# Patient Record
Sex: Male | Born: 1985
Health system: Southern US, Community
[De-identification: ages and names within clinical notes are randomized; demographics above are authoritative.]

## PROBLEM LIST (undated history)

## (undated) DIAGNOSIS — T8859XA Other complications of anesthesia, initial encounter: Secondary | ICD-10-CM

## (undated) DIAGNOSIS — T4145XA Adverse effect of unspecified anesthetic, initial encounter: Secondary | ICD-10-CM

## (undated) DIAGNOSIS — R112 Nausea with vomiting, unspecified: Secondary | ICD-10-CM

## (undated) DIAGNOSIS — Z9889 Other specified postprocedural states: Secondary | ICD-10-CM

---
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
## 2019-07-23 ENCOUNTER — Inpatient Hospital Stay (HOSPITAL_COMMUNITY)
Admission: EM | Admit: 2019-07-23 | Discharge: 2019-07-31 | DRG: 330 | Disposition: A | Discharge status: Home Health | Payer: No Typology Code available for payment source | Attending: Physician Assistant | Admitting: Physician Assistant

## 2019-07-23 ENCOUNTER — Encounter (HOSPITAL_COMMUNITY): Payer: Self-pay | Admitting: Emergency Medicine

## 2019-07-23 DIAGNOSIS — S36409A Unspecified injury of unspecified part of small intestine, initial encounter: Principal | ICD-10-CM | POA: Diagnosis present

## 2019-07-23 DIAGNOSIS — W3400XA Accidental discharge from unspecified firearms or gun, initial encounter: Secondary | ICD-10-CM

## 2019-07-23 DIAGNOSIS — S36898A Other injury of other intra-abdominal organs, initial encounter: Secondary | ICD-10-CM | POA: Diagnosis present

## 2019-07-23 DIAGNOSIS — D62 Acute posthemorrhagic anemia: Secondary | ICD-10-CM | POA: Diagnosis not present

## 2019-07-23 DIAGNOSIS — Z0189 Encounter for other specified special examinations: Secondary | ICD-10-CM

## 2019-07-23 DIAGNOSIS — K567 Ileus, unspecified: Secondary | ICD-10-CM | POA: Diagnosis not present

## 2019-07-23 DIAGNOSIS — Z9889 Other specified postprocedural states: Secondary | ICD-10-CM

## 2019-07-23 DIAGNOSIS — E119 Type 2 diabetes mellitus without complications: Secondary | ICD-10-CM | POA: Diagnosis present

## 2019-07-23 DIAGNOSIS — N179 Acute kidney failure, unspecified: Secondary | ICD-10-CM | POA: Diagnosis not present

## 2019-07-23 DIAGNOSIS — T799XXA Unspecified early complication of trauma, initial encounter: Secondary | ICD-10-CM

## 2019-07-23 DIAGNOSIS — S36419A Primary blast injury of unspecified part of small intestine, initial encounter: Secondary | ICD-10-CM

## 2019-07-23 DIAGNOSIS — R12 Heartburn: Secondary | ICD-10-CM | POA: Diagnosis not present

## 2019-07-23 DIAGNOSIS — Z20822 Contact with and (suspected) exposure to covid-19: Secondary | ICD-10-CM | POA: Diagnosis present

## 2019-07-23 DIAGNOSIS — T07XXXA Unspecified multiple injuries, initial encounter: Secondary | ICD-10-CM

## 2019-07-23 DIAGNOSIS — R202 Paresthesia of skin: Secondary | ICD-10-CM | POA: Diagnosis not present

## 2019-07-23 DIAGNOSIS — E876 Hypokalemia: Secondary | ICD-10-CM | POA: Diagnosis not present

## 2019-07-23 DIAGNOSIS — R42 Dizziness and giddiness: Secondary | ICD-10-CM | POA: Diagnosis not present

## 2019-07-23 DIAGNOSIS — Y249XXA Unspecified firearm discharge, undetermined intent, initial encounter: Secondary | ICD-10-CM

## 2019-07-23 HISTORY — DX: Other complications of anesthesia, initial encounter: T88.59XA

## 2019-07-23 HISTORY — DX: Other specified postprocedural states: Z98.890

## 2019-07-23 HISTORY — DX: Other specified postprocedural states: R11.2

## 2019-07-24 ENCOUNTER — Emergency Department (HOSPITAL_COMMUNITY): Payer: No Typology Code available for payment source

## 2019-07-24 ENCOUNTER — Encounter (HOSPITAL_COMMUNITY): Payer: Self-pay | Admitting: Anesthesiology

## 2019-07-24 ENCOUNTER — Emergency Department (HOSPITAL_COMMUNITY): Payer: No Typology Code available for payment source | Admitting: Anesthesiology

## 2019-07-24 ENCOUNTER — Encounter (HOSPITAL_COMMUNITY): Admission: EM | Disposition: A | Discharge status: Home Health | Payer: Self-pay | Source: Home / Self Care

## 2019-07-24 ENCOUNTER — Inpatient Hospital Stay (HOSPITAL_COMMUNITY): Payer: No Typology Code available for payment source

## 2019-07-24 DIAGNOSIS — R12 Heartburn: Secondary | ICD-10-CM | POA: Diagnosis not present

## 2019-07-24 DIAGNOSIS — R202 Paresthesia of skin: Secondary | ICD-10-CM | POA: Diagnosis not present

## 2019-07-24 DIAGNOSIS — R42 Dizziness and giddiness: Secondary | ICD-10-CM | POA: Diagnosis not present

## 2019-07-24 DIAGNOSIS — Z9889 Other specified postprocedural states: Secondary | ICD-10-CM

## 2019-07-24 DIAGNOSIS — W3400XA Accidental discharge from unspecified firearms or gun, initial encounter: Secondary | ICD-10-CM

## 2019-07-24 DIAGNOSIS — T799XXA Unspecified early complication of trauma, initial encounter: Secondary | ICD-10-CM | POA: Diagnosis present

## 2019-07-24 DIAGNOSIS — S36409A Unspecified injury of unspecified part of small intestine, initial encounter: Secondary | ICD-10-CM | POA: Diagnosis present

## 2019-07-24 DIAGNOSIS — S36419A Primary blast injury of unspecified part of small intestine, initial encounter: Secondary | ICD-10-CM

## 2019-07-24 DIAGNOSIS — Z20822 Contact with and (suspected) exposure to covid-19: Secondary | ICD-10-CM | POA: Diagnosis present

## 2019-07-24 DIAGNOSIS — Y249XXA Unspecified firearm discharge, undetermined intent, initial encounter: Secondary | ICD-10-CM

## 2019-07-24 DIAGNOSIS — S36898A Other injury of other intra-abdominal organs, initial encounter: Secondary | ICD-10-CM | POA: Diagnosis present

## 2019-07-24 DIAGNOSIS — N179 Acute kidney failure, unspecified: Secondary | ICD-10-CM | POA: Diagnosis not present

## 2019-07-24 DIAGNOSIS — E876 Hypokalemia: Secondary | ICD-10-CM | POA: Diagnosis not present

## 2019-07-24 DIAGNOSIS — K567 Ileus, unspecified: Secondary | ICD-10-CM | POA: Diagnosis not present

## 2019-07-24 DIAGNOSIS — E119 Type 2 diabetes mellitus without complications: Secondary | ICD-10-CM | POA: Diagnosis present

## 2019-07-24 DIAGNOSIS — D62 Acute posthemorrhagic anemia: Secondary | ICD-10-CM | POA: Diagnosis not present

## 2019-07-24 HISTORY — DX: Unspecified firearm discharge, undetermined intent, initial encounter: Y24.9XXA

## 2019-07-24 HISTORY — DX: Accidental discharge from unspecified firearms or gun, initial encounter: W34.00XA

## 2019-07-24 HISTORY — PX: LAPAROTOMY: SHX154

## 2019-07-24 LAB — CBC
HCT: 31.7 % — ABNORMAL LOW (ref 39.0–52.0)
HCT: 43.3 % (ref 39.0–52.0)
Hemoglobin: 10 g/dL — ABNORMAL LOW (ref 13.0–17.0)
Hemoglobin: 14.2 g/dL (ref 13.0–17.0)
MCH: 31.6 pg (ref 26.0–34.0)
MCH: 31.7 pg (ref 26.0–34.0)
MCHC: 31.5 g/dL (ref 30.0–36.0)
MCHC: 32.8 g/dL (ref 30.0–36.0)
MCV: 100.3 fL — ABNORMAL HIGH (ref 80.0–100.0)
MCV: 96.7 fL (ref 80.0–100.0)
Platelets: 220 10*3/uL (ref 150–400)
Platelets: 289 10*3/uL (ref 150–400)
RBC: 3.16 MIL/uL — ABNORMAL LOW (ref 4.22–5.81)
RBC: 4.48 MIL/uL (ref 4.22–5.81)
RDW: 12.9 % (ref 11.5–15.5)
RDW: 13.1 % (ref 11.5–15.5)
WBC: 12.3 10*3/uL — ABNORMAL HIGH (ref 4.0–10.5)
WBC: 8.7 10*3/uL (ref 4.0–10.5)
nRBC: 0 % (ref 0.0–0.2)
nRBC: 0 % (ref 0.0–0.2)

## 2019-07-24 LAB — PREPARE FRESH FROZEN PLASMA
Unit division: 0
Unit division: 0
Unit division: 0

## 2019-07-24 LAB — BPAM FFP
Blood Product Expiration Date: 202107082359
Blood Product Expiration Date: 202107082359
Blood Product Expiration Date: 202107082359
Blood Product Expiration Date: 202107082359
ISSUE DATE / TIME: 202107040001
ISSUE DATE / TIME: 202107040001
ISSUE DATE / TIME: 202107040036
ISSUE DATE / TIME: 202107040036
Unit Type and Rh: 600
Unit Type and Rh: 6200
Unit Type and Rh: 6200
Unit Type and Rh: 6200

## 2019-07-24 LAB — COMPREHENSIVE METABOLIC PANEL WITH GFR
ALT: 26 U/L (ref 0–44)
AST: 26 U/L (ref 15–41)
Albumin: 3.6 g/dL (ref 3.5–5.0)
Alkaline Phosphatase: 62 U/L (ref 38–126)
Anion gap: 13 (ref 5–15)
BUN: 16 mg/dL (ref 6–20)
CO2: 17 mmol/L — ABNORMAL LOW (ref 22–32)
Calcium: 7.8 mg/dL — ABNORMAL LOW (ref 8.9–10.3)
Chloride: 106 mmol/L (ref 98–111)
Creatinine, Ser: 1.53 mg/dL — ABNORMAL HIGH (ref 0.61–1.24)
GFR calc Af Amer: >60 mL/min
GFR calc non Af Amer: 58 mL/min — ABNORMAL LOW
Glucose, Bld: 200 mg/dL — ABNORMAL HIGH (ref 70–99)
Potassium: 3.8 mmol/L (ref 3.5–5.1)
Sodium: 136 mmol/L (ref 135–145)
Total Bilirubin: 0.8 mg/dL (ref 0.3–1.2)
Total Protein: 6.3 g/dL — ABNORMAL LOW (ref 6.5–8.1)

## 2019-07-24 LAB — BASIC METABOLIC PANEL
Anion gap: 9 (ref 5–15)
BUN: 16 mg/dL (ref 6–20)
CO2: 18 mmol/L — ABNORMAL LOW (ref 22–32)
Calcium: 7 mg/dL — ABNORMAL LOW (ref 8.9–10.3)
Chloride: 110 mmol/L (ref 98–111)
Creatinine, Ser: 1.33 mg/dL — ABNORMAL HIGH (ref 0.61–1.24)
GFR calc Af Amer: >60 mL/min (ref >60)
GFR calc non Af Amer: >60 mL/min (ref >60)
Glucose, Bld: 207 mg/dL — ABNORMAL HIGH (ref 70–99)
Potassium: 4.2 mmol/L (ref 3.5–5.1)
Sodium: 137 mmol/L (ref 135–145)

## 2019-07-24 LAB — LACTIC ACID, PLASMA
Lactic Acid, Venous: 3.3 mmol/L (ref 0.5–1.9)
Lactic Acid, Venous: 4.3 mmol/L (ref 0.5–1.9)

## 2019-07-24 LAB — I-STAT CHEM 8, ED
BUN: 18 mg/dL (ref 6–20)
Calcium, Ion: 1.08 mmol/L — ABNORMAL LOW (ref 1.15–1.40)
Chloride: 105 mmol/L (ref 98–111)
Creatinine, Ser: 1.5 mg/dL — ABNORMAL HIGH (ref 0.61–1.24)
Glucose, Bld: 195 mg/dL — ABNORMAL HIGH (ref 70–99)
HCT: 41 % (ref 39.0–52.0)
Hemoglobin: 13.9 g/dL (ref 13.0–17.0)
Potassium: 3.9 mmol/L (ref 3.5–5.1)
Sodium: 138 mmol/L (ref 135–145)
TCO2: 21 mmol/L — ABNORMAL LOW (ref 22–32)

## 2019-07-24 LAB — TRAUMA TEG PANEL
CFF Max Amplitude: 17.2 mm (ref 15–32)
Citrated Kaolin (R): 5.2 min (ref 4.6–9.1)
Citrated Rapid TEG (MA): 59.6 mm (ref 52–70)
Lysis at 30 Minutes: 0.2 % (ref 0.0–2.6)

## 2019-07-24 LAB — HIV ANTIBODY (ROUTINE TESTING W REFLEX): HIV Screen 4th Generation wRfx: NONREACTIVE

## 2019-07-24 LAB — SARS CORONAVIRUS 2 BY RT PCR (HOSPITAL ORDER, PERFORMED IN ~~LOC~~ HOSPITAL LAB): SARS Coronavirus 2: NEGATIVE

## 2019-07-24 LAB — ETHANOL: Alcohol, Ethyl (B): <10 mg/dL

## 2019-07-24 LAB — PROTIME-INR
INR: 1.1 (ref 0.8–1.2)
Prothrombin Time: 13.3 seconds (ref 11.4–15.2)

## 2019-07-24 LAB — ABO/RH: ABO/RH(D): O POS

## 2019-07-24 LAB — MRSA PCR SCREENING: MRSA by PCR: NEGATIVE

## 2019-07-24 SURGERY — LAPAROTOMY, EXPLORATORY
Anesthesia: General | Site: Abdomen

## 2019-07-24 MED ORDER — LIDOCAINE 2% (20 MG/ML) 5 ML SYRINGE
INTRAMUSCULAR | Status: DC | PRN
  Administered 2019-07-24: 70 mg via INTRAVENOUS

## 2019-07-24 MED ORDER — PROPOFOL 10 MG/ML IV BOLUS
INTRAVENOUS | Status: DC | PRN
  Administered 2019-07-24: 110 mg via INTRAVENOUS

## 2019-07-24 MED ORDER — 0.9 % SODIUM CHLORIDE (POUR BTL) OPTIME
TOPICAL | Status: DC | PRN
  Administered 2019-07-24: 2000 mL

## 2019-07-24 MED ORDER — HYDROMORPHONE HCL 1 MG/ML IJ SOLN
0.2500 mg | INTRAMUSCULAR | Status: DC | PRN
  Administered 2019-07-24: 0.5 mg via INTRAVENOUS

## 2019-07-24 MED ORDER — SODIUM CHLORIDE 0.9 % IV BOLUS
1000.0000 mL | Freq: Once | INTRAVENOUS | Status: AC
  Administered 2019-07-24: 1000 mL via INTRAVENOUS

## 2019-07-24 MED ORDER — ONDANSETRON 4 MG PO TBDP: 4.0000 mg | ORAL_TABLET | Freq: Four times a day (QID) | ORAL | Status: DC | PRN

## 2019-07-24 MED ORDER — ONDANSETRON HCL 4 MG/2ML IJ SOLN
4.0000 mg | Freq: Four times a day (QID) | INTRAMUSCULAR | Status: DC | PRN
  Administered 2019-07-24 – 2019-07-27 (×4): 4 mg via INTRAVENOUS
  Filled 2019-07-24 (×4): qty 2

## 2019-07-24 MED ORDER — FENTANYL CITRATE (PF) 100 MCG/2ML IJ SOLN
INTRAMUSCULAR | Status: DC | PRN
  Administered 2019-07-24 (×4): 50 ug via INTRAVENOUS

## 2019-07-24 MED ORDER — ACETAMINOPHEN 500 MG PO TABS
1000.0000 mg | ORAL_TABLET | Freq: Four times a day (QID) | ORAL | Status: DC
  Administered 2019-07-24 – 2019-07-31 (×23): 1000 mg via ORAL
  Filled 2019-07-24 (×28): qty 2

## 2019-07-24 MED ORDER — ONDANSETRON HCL 4 MG/2ML IJ SOLN
INTRAMUSCULAR | Status: DC | PRN
  Administered 2019-07-24: 4 mg via INTRAVENOUS

## 2019-07-24 MED ORDER — IOHEXOL 300 MG/ML  SOLN
100.0000 mL | Freq: Once | INTRAMUSCULAR | Status: AC | PRN
  Administered 2019-07-24: 100 mL via INTRAVENOUS

## 2019-07-24 MED ORDER — TETANUS-DIPHTH-ACELL PERTUSSIS 5-2.5-18.5 LF-MCG/0.5 IM SUSP
0.5000 mL | Freq: Once | INTRAMUSCULAR | Status: AC
  Administered 2019-07-24: 0.5 mL via INTRAMUSCULAR
  Filled 2019-07-24: qty 0.5

## 2019-07-24 MED ORDER — OXYCODONE HCL 5 MG PO TABS: 5.0000 mg | ORAL_TABLET | ORAL | Status: DC | PRN

## 2019-07-24 MED ORDER — SODIUM CHLORIDE 0.9 % IV SOLN: INTRAVENOUS | Status: DC

## 2019-07-24 MED ORDER — MIDAZOLAM HCL 5 MG/5ML IJ SOLN
INTRAMUSCULAR | Status: DC | PRN
  Administered 2019-07-24: 1 mg via INTRAVENOUS

## 2019-07-24 MED ORDER — OXYCODONE HCL 5 MG PO TABS
5.0000 mg | ORAL_TABLET | ORAL | Status: DC | PRN
  Administered 2019-07-24 – 2019-07-30 (×8): 10 mg via ORAL
  Filled 2019-07-24 (×8): qty 2

## 2019-07-24 MED ORDER — LIDOCAINE 5 % EX PTCH
1.0000 | MEDICATED_PATCH | CUTANEOUS | Status: DC
  Administered 2019-07-24 – 2019-07-30 (×6): 1 via TRANSDERMAL
  Filled 2019-07-24 (×6): qty 1

## 2019-07-24 MED ORDER — DOCUSATE SODIUM 100 MG PO CAPS
100.0000 mg | ORAL_CAPSULE | Freq: Two times a day (BID) | ORAL | Status: DC
  Administered 2019-07-24 – 2019-07-30 (×12): 100 mg via ORAL
  Filled 2019-07-24 (×14): qty 1

## 2019-07-24 MED ORDER — DEXAMETHASONE SODIUM PHOSPHATE 10 MG/ML IJ SOLN
INTRAMUSCULAR | Status: DC | PRN
  Administered 2019-07-24: 5 mg via INTRAVENOUS

## 2019-07-24 MED ORDER — SODIUM CHLORIDE 0.9 % IV SOLN: INTRAVENOUS | Status: DC | PRN

## 2019-07-24 MED ORDER — MORPHINE SULFATE (PF) 2 MG/ML IV SOLN
2.0000 mg | INTRAVENOUS | Status: DC | PRN
  Administered 2019-07-24 (×3): 2 mg via INTRAVENOUS
  Administered 2019-07-24: 4 mg via INTRAVENOUS
  Administered 2019-07-24 – 2019-07-25 (×3): 2 mg via INTRAVENOUS
  Filled 2019-07-24 (×2): qty 1
  Filled 2019-07-24: qty 2
  Filled 2019-07-24: qty 1
  Filled 2019-07-24: qty 2
  Filled 2019-07-24: qty 1

## 2019-07-24 MED ORDER — ACETAMINOPHEN 325 MG PO TABS
650.0000 mg | ORAL_TABLET | ORAL | Status: DC | PRN
  Administered 2019-07-24: 650 mg via ORAL
  Filled 2019-07-24: qty 2

## 2019-07-24 MED ORDER — SUCCINYLCHOLINE CHLORIDE 20 MG/ML IJ SOLN
INTRAMUSCULAR | Status: DC | PRN
  Administered 2019-07-24: 150 mg via INTRAVENOUS

## 2019-07-24 MED ORDER — MIDAZOLAM HCL 2 MG/2ML IJ SOLN
INTRAMUSCULAR | Status: AC
  Filled 2019-07-24: qty 2

## 2019-07-24 MED ORDER — ENOXAPARIN SODIUM 40 MG/0.4ML ~~LOC~~ SOLN
40.0000 mg | SUBCUTANEOUS | Status: DC
  Administered 2019-07-24 – 2019-07-26 (×3): 40 mg via SUBCUTANEOUS
  Filled 2019-07-24 (×3): qty 0.4

## 2019-07-24 MED ORDER — SUGAMMADEX SODIUM 200 MG/2ML IV SOLN
INTRAVENOUS | Status: DC | PRN
  Administered 2019-07-24: 200 mg via INTRAVENOUS

## 2019-07-24 MED ORDER — ROCURONIUM BROMIDE 10 MG/ML (PF) SYRINGE
PREFILLED_SYRINGE | INTRAVENOUS | Status: DC | PRN
  Administered 2019-07-24: 50 mg via INTRAVENOUS
  Administered 2019-07-24: 20 mg via INTRAVENOUS

## 2019-07-24 MED ORDER — METHOCARBAMOL 500 MG PO TABS
500.0000 mg | ORAL_TABLET | Freq: Four times a day (QID) | ORAL | Status: DC
  Administered 2019-07-24 – 2019-07-26 (×11): 500 mg via ORAL
  Filled 2019-07-24 (×10): qty 1

## 2019-07-24 MED ORDER — FENTANYL CITRATE (PF) 250 MCG/5ML IJ SOLN
INTRAMUSCULAR | Status: AC
  Filled 2019-07-24: qty 5

## 2019-07-24 MED ORDER — HYDROMORPHONE HCL 1 MG/ML IJ SOLN
INTRAMUSCULAR | Status: AC
  Administered 2019-07-24: 0.25 mg
  Filled 2019-07-24: qty 1

## 2019-07-24 MED ORDER — CALCIUM GLUCONATE-NACL 1-0.675 GM/50ML-% IV SOLN
1.0000 g | Freq: Once | INTRAVENOUS | Status: AC
  Administered 2019-07-24: 1000 mg via INTRAVENOUS
  Filled 2019-07-24: qty 50

## 2019-07-24 MED ORDER — METOPROLOL TARTRATE 5 MG/5ML IV SOLN: 5.0000 mg | Freq: Four times a day (QID) | INTRAVENOUS | Status: DC | PRN

## 2019-07-24 MED ORDER — CEFAZOLIN SODIUM-DEXTROSE 2-4 GM/100ML-% IV SOLN
2.0000 g | Freq: Once | INTRAVENOUS | Status: AC
  Administered 2019-07-24: 2 g via INTRAVENOUS
  Filled 2019-07-24: qty 100

## 2019-07-24 MED ORDER — PHENYLEPHRINE HCL-NACL 10-0.9 MG/250ML-% IV SOLN
INTRAVENOUS | Status: DC | PRN
  Administered 2019-07-24: 50 ug/min via INTRAVENOUS

## 2019-07-24 SURGICAL SUPPLY — 40 items
COVER SURGICAL LIGHT HANDLE (MISCELLANEOUS) ×3 IMPLANT
DRAPE LAPAROSCOPIC ABDOMINAL (DRAPES) ×3 IMPLANT
DRSG OPSITE POSTOP 4X10 (GAUZE/BANDAGES/DRESSINGS) IMPLANT
DRSG OPSITE POSTOP 4X8 (GAUZE/BANDAGES/DRESSINGS) ×3 IMPLANT
ELECT BLADE 4.0 EZ CLEAN MEGAD (MISCELLANEOUS) ×3
ELECT BLADE 6.5 EXT (BLADE) IMPLANT
ELECT CAUTERY BLADE 6.4 (BLADE) ×3 IMPLANT
ELECT REM PT RETURN 9FT ADLT (ELECTROSURGICAL) ×3
ELECTRODE BLDE 4.0 EZ CLN MEGD (MISCELLANEOUS) ×1 IMPLANT
ELECTRODE REM PT RTRN 9FT ADLT (ELECTROSURGICAL) ×1 IMPLANT
GAUZE SPONGE 4X4 12PLY STRL (GAUZE/BANDAGES/DRESSINGS) ×3 IMPLANT
GAUZE SPONGE 4X4 12PLY STRL LF (GAUZE/BANDAGES/DRESSINGS) ×3 IMPLANT
GLOVE BIOGEL PI IND STRL 7.0 (GLOVE) ×1 IMPLANT
GLOVE BIOGEL PI IND STRL 8 (GLOVE) ×1 IMPLANT
GLOVE BIOGEL PI INDICATOR 7.0 (GLOVE) ×2
GLOVE BIOGEL PI INDICATOR 8 (GLOVE) ×2
GLOVE SURG SS PI 7.0 STRL IVOR (GLOVE) ×3 IMPLANT
GOWN STRL REUS W/ TWL LRG LVL3 (GOWN DISPOSABLE) ×3 IMPLANT
GOWN STRL REUS W/TWL LRG LVL3 (GOWN DISPOSABLE) ×6
HANDLE SUCTION POOLE (INSTRUMENTS) ×1 IMPLANT
KIT BASIN OR (CUSTOM PROCEDURE TRAY) ×3 IMPLANT
LIGASURE IMPACT 36 18CM CVD LR (INSTRUMENTS) ×3 IMPLANT
PACK GENERAL/GYN (CUSTOM PROCEDURE TRAY) ×3 IMPLANT
PENCIL SMOKE EVACUATOR (MISCELLANEOUS) ×3 IMPLANT
RELOAD PROXIMATE 75MM BLUE (ENDOMECHANICALS) ×6 IMPLANT
SPONGE LAP 18X18 RF (DISPOSABLE) ×9 IMPLANT
STAPLER VISISTAT 35W (STAPLE) ×3 IMPLANT
SUCTION POOLE HANDLE (INSTRUMENTS) ×3
SUT PDS AB 0 CTX 36 PDP370T (SUTURE) ×6 IMPLANT
SUT PDS AB 1 TP1 96 (SUTURE) IMPLANT
SUT SILK 2 0 (SUTURE) ×2
SUT SILK 2 0 SH CR/8 (SUTURE) ×3 IMPLANT
SUT SILK 2-0 18XBRD TIE 12 (SUTURE) ×1 IMPLANT
SUT SILK 3 0 (SUTURE) ×2
SUT SILK 3 0 SH CR/8 (SUTURE) ×6 IMPLANT
SUT SILK 3-0 18XBRD TIE 12 (SUTURE) ×1 IMPLANT
TAPE CLOTH SURG 4X10 WHT LF (GAUZE/BANDAGES/DRESSINGS) ×6 IMPLANT
TOWEL GREEN STERILE (TOWEL DISPOSABLE) ×3 IMPLANT
TRAY FOLEY MTR SLVR 16FR STAT (SET/KITS/TRAYS/PACK) ×3 IMPLANT
YANKAUER SUCT BULB TIP NO VENT (SUCTIONS) IMPLANT

## 2019-07-24 NOTE — Anesthesia Preprocedure Evaluation (Signed)
Anesthesia Evaluation  Patient identified by MRN, date of birth, ID band Patient awake    Reviewed: Allergy & Precautions, NPO status , Patient's Chart, lab work & pertinent test results  Airway Mallampati: II  TM Distance: >3 FB Neck ROM: Full    Dental no notable dental hx.    Pulmonary neg pulmonary ROS,    Pulmonary exam normal breath sounds clear to auscultation       Cardiovascular negative cardio ROS Normal cardiovascular exam Rhythm:Regular Rate:Normal     Neuro/Psych negative neurological ROS  negative psych ROS   GI/Hepatic negative GI ROS, Neg liver ROS,   Endo/Other  diabetes  Renal/GU Renal diseaseElevated Cr  negative genitourinary   Musculoskeletal negative musculoskeletal ROS (+)   Abdominal   Peds negative pediatric ROS (+)  Hematology negative hematology ROS (+)   Anesthesia Other Findings   Reproductive/Obstetrics negative OB ROS                             Anesthesia Physical Anesthesia Plan  ASA: II and emergent  Anesthesia Plan: General   Post-op Pain Management:    Induction: Intravenous and Rapid sequence  PONV Risk Score and Plan: 2 and Ondansetron, Dexamethasone and Treatment may vary due to age or medical condition  Airway Management Planned: Oral ETT  Additional Equipment:   Intra-op Plan:   Post-operative Plan: Extubation in OR  Informed Consent: I have reviewed the patients History and Physical, chart, labs and discussed the procedure including the risks, benefits and alternatives for the proposed anesthesia with the patient or authorized representative who has indicated his/her understanding and acceptance.     Dental advisory given  Plan Discussed with: CRNA and Surgeon  Anesthesia Plan Comments:         Anesthesia Quick Evaluation

## 2019-07-24 NOTE — Progress Notes (Signed)
Received patient in a lot of pain, PRN Morphine IV given as ordered
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
## 2019-07-24 NOTE — Progress Notes (Signed)
Patient transported to and from CT without incident.
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
## 2019-07-24 NOTE — Transfer of Care (Signed)
Immediate Anesthesia Transfer of Care Note  Patient: Jeffrey Foley  Procedure(s) Performed: EXPLORATORY LAPAROTOMY, SMALL BOWEL RESECTION AND REPAIR (N/A Abdomen)  Patient Location: PACU  Anesthesia Type:General  Level of Consciousness: drowsy and responds to stimulation  Airway & Oxygen Therapy: Patient Spontanous Breathing and Patient connected to nasal cannula oxygen  Post-op Assessment: Report given to RN and Post -op Vital signs reviewed and stable  Post vital signs: Reviewed and stable  Last Vitals:  Vitals Value Taken Time  BP 103/67 07/24/19 0203  Temp    Pulse 99 07/24/19 0209  Resp 16 07/24/19 0209  SpO2 100 % 07/24/19 0209  Vitals shown include unvalidated device data.  Last Pain:  Vitals:   07/24/19 0205  TempSrc:   PainSc: (P) 5       Patients Stated Pain Goal: (P) 4 (07/24/19 0205)  Complications: No complications documented.

## 2019-07-24 NOTE — Progress Notes (Signed)
Patient passed bedside swallow screen with nurse. 

## 2019-07-24 NOTE — Anesthesia Procedure Notes (Signed)
Procedure Name: Intubation Date/Time: 07/24/2019 12:30 AM Performed by: Edmonia Caprio, CRNA Pre-anesthesia Checklist: Patient identified, Emergency Drugs available, Suction available, Patient being monitored and Timeout performed Patient Re-evaluated:Patient Re-evaluated prior to induction Oxygen Delivery Method: Circle system utilized Preoxygenation: Pre-oxygenation with 100% oxygen Induction Type: IV induction, Rapid sequence and Cricoid Pressure applied Laryngoscope Size: Glidescope and 3 Grade View: Grade I Tube type: Oral Tube size: 8.0 mm Number of attempts: 1 Airway Equipment and Method: Video-laryngoscopy and Stylet Placement Confirmation: ETT inserted through vocal cords under direct vision and breath sounds checked- equal and bilateral Secured at: 21 cm Tube secured with: Tape Dental Injury: Teeth and Oropharynx as per pre-operative assessment
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
## 2019-07-25 LAB — CBC
HCT: 18.9 % — ABNORMAL LOW (ref 39.0–52.0)
Hemoglobin: 6.4 g/dL — CL (ref 13.0–17.0)
MCH: 32.5 pg (ref 26.0–34.0)
MCHC: 33.9 g/dL (ref 30.0–36.0)
MCV: 95.9 fL (ref 80.0–100.0)
Platelets: 184 10*3/uL (ref 150–400)
RBC: 1.97 MIL/uL — ABNORMAL LOW (ref 4.22–5.81)
RDW: 13.5 % (ref 11.5–15.5)
WBC: 8.5 10*3/uL (ref 4.0–10.5)
nRBC: 0 % (ref 0.0–0.2)

## 2019-07-25 LAB — BASIC METABOLIC PANEL
Anion gap: 8 (ref 5–15)
BUN: 21 mg/dL — ABNORMAL HIGH (ref 6–20)
CO2: 20 mmol/L — ABNORMAL LOW (ref 22–32)
Calcium: 7.1 mg/dL — ABNORMAL LOW (ref 8.9–10.3)
Chloride: 108 mmol/L (ref 98–111)
Creatinine, Ser: 1.15 mg/dL (ref 0.61–1.24)
GFR calc Af Amer: >60 mL/min (ref >60)
GFR calc non Af Amer: >60 mL/min (ref >60)
Glucose, Bld: 133 mg/dL — ABNORMAL HIGH (ref 70–99)
Potassium: 3.8 mmol/L (ref 3.5–5.1)
Sodium: 136 mmol/L (ref 135–145)

## 2019-07-25 LAB — PREPARE RBC (CROSSMATCH)

## 2019-07-25 MED ORDER — SODIUM CHLORIDE 0.9% IV SOLUTION: Freq: Once | INTRAVENOUS | Status: AC
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
## 2019-07-25 NOTE — Progress Notes (Signed)
CRITICAL VALUE ALERT  Critical Value:  Hgb 6.4  Date & Time Notied:  07/25/2019 0445  Provider Notified: 0500  Orders Received/Actions taken: unit of blood ordered

## 2019-07-25 NOTE — Progress Notes (Signed)
Unit of PRBC transfused no signs of adverse noted. VS stable

## 2019-07-25 NOTE — Progress Notes (Signed)
Unit of prbcs transfusing as ordered

## 2019-07-26 ENCOUNTER — Encounter (HOSPITAL_COMMUNITY): Payer: Self-pay | Admitting: General Surgery

## 2019-07-26 HISTORY — PX: EXPLORATORY LAPAROTOMY: SUR591

## 2019-07-26 LAB — BASIC METABOLIC PANEL
Anion gap: 8 (ref 5–15)
BUN: 11 mg/dL (ref 6–20)
CO2: 22 mmol/L (ref 22–32)
Calcium: 7.4 mg/dL — ABNORMAL LOW (ref 8.9–10.3)
Chloride: 107 mmol/L (ref 98–111)
Creatinine, Ser: 0.81 mg/dL (ref 0.61–1.24)
GFR calc Af Amer: >60 mL/min (ref >60)
GFR calc non Af Amer: >60 mL/min (ref >60)
Glucose, Bld: 121 mg/dL — ABNORMAL HIGH (ref 70–99)
Potassium: 3.6 mmol/L (ref 3.5–5.1)
Sodium: 137 mmol/L (ref 135–145)

## 2019-07-26 LAB — CBC
HCT: 22.2 % — ABNORMAL LOW (ref 39.0–52.0)
Hemoglobin: 7.5 g/dL — ABNORMAL LOW (ref 13.0–17.0)
MCH: 32.3 pg (ref 26.0–34.0)
MCHC: 33.8 g/dL (ref 30.0–36.0)
MCV: 95.7 fL (ref 80.0–100.0)
Platelets: 165 10*3/uL (ref 150–400)
RBC: 2.32 MIL/uL — ABNORMAL LOW (ref 4.22–5.81)
RDW: 14 % (ref 11.5–15.5)
WBC: 9.7 10*3/uL (ref 4.0–10.5)
nRBC: 0.2 % (ref 0.0–0.2)

## 2019-07-26 LAB — SURGICAL PATHOLOGY

## 2019-07-26 LAB — BLOOD PRODUCT ORDER (VERBAL) VERIFICATION

## 2019-07-26 LAB — HEMOGLOBIN AND HEMATOCRIT, BLOOD
HCT: 26.9 % — ABNORMAL LOW (ref 39.0–52.0)
Hemoglobin: 9.2 g/dL — ABNORMAL LOW (ref 13.0–17.0)

## 2019-07-26 LAB — ABO/RH: ABO/RH(D): O POS

## 2019-07-26 LAB — MAGNESIUM: Magnesium: 1.7 mg/dL (ref 1.7–2.4)

## 2019-07-26 LAB — PREPARE RBC (CROSSMATCH)

## 2019-07-26 MED ORDER — DIPHENHYDRAMINE HCL 25 MG PO CAPS
25.0000 mg | ORAL_CAPSULE | Freq: Every evening | ORAL | Status: DC | PRN
  Administered 2019-07-26: 25 mg via ORAL
  Filled 2019-07-26: qty 1

## 2019-07-26 MED ORDER — SODIUM CHLORIDE 0.9% IV SOLUTION: Freq: Once | INTRAVENOUS | Status: DC

## 2019-07-26 MED ORDER — MORPHINE SULFATE (PF) 2 MG/ML IV SOLN: 2.0000 mg | INTRAVENOUS | Status: DC | PRN
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
## 2019-07-27 ENCOUNTER — Inpatient Hospital Stay (HOSPITAL_COMMUNITY): Payer: No Typology Code available for payment source

## 2019-07-27 LAB — CBC
HCT: 27.7 % — ABNORMAL LOW (ref 39.0–52.0)
Hemoglobin: 9.3 g/dL — ABNORMAL LOW (ref 13.0–17.0)
MCH: 31.4 pg (ref 26.0–34.0)
MCHC: 33.6 g/dL (ref 30.0–36.0)
MCV: 93.6 fL (ref 80.0–100.0)
Platelets: 187 10*3/uL (ref 150–400)
RBC: 2.96 MIL/uL — ABNORMAL LOW (ref 4.22–5.81)
RDW: 14.1 % (ref 11.5–15.5)
WBC: 10 10*3/uL (ref 4.0–10.5)
nRBC: 0.7 % — ABNORMAL HIGH (ref 0.0–0.2)

## 2019-07-27 LAB — TYPE AND SCREEN
ABO/RH(D): O POS
Antibody Screen: NEGATIVE
Unit division: 0
Unit division: 0
Unit division: 0
Unit division: 0

## 2019-07-27 LAB — BPAM RBC
Blood Product Expiration Date: 202107242359
Blood Product Expiration Date: 202107242359
Blood Product Expiration Date: 202108052359
Blood Product Expiration Date: 202108052359
ISSUE DATE / TIME: 202107041107
ISSUE DATE / TIME: 202107041659
ISSUE DATE / TIME: 202107050611
ISSUE DATE / TIME: 202107061212
Unit Type and Rh: 5100
Unit Type and Rh: 5100
Unit Type and Rh: 5100
Unit Type and Rh: 5100

## 2019-07-27 LAB — MAGNESIUM: Magnesium: 2.1 mg/dL (ref 1.7–2.4)

## 2019-07-27 LAB — BASIC METABOLIC PANEL
Anion gap: 11 (ref 5–15)
BUN: 15 mg/dL (ref 6–20)
CO2: 24 mmol/L (ref 22–32)
Calcium: 8 mg/dL — ABNORMAL LOW (ref 8.9–10.3)
Chloride: 101 mmol/L (ref 98–111)
Creatinine, Ser: 0.81 mg/dL (ref 0.61–1.24)
GFR calc Af Amer: >60 mL/min (ref >60)
GFR calc non Af Amer: >60 mL/min (ref >60)
Glucose, Bld: 108 mg/dL — ABNORMAL HIGH (ref 70–99)
Potassium: 3.6 mmol/L (ref 3.5–5.1)
Sodium: 136 mmol/L (ref 135–145)

## 2019-07-27 LAB — PHOSPHORUS: Phosphorus: 2.6 mg/dL (ref 2.5–4.6)

## 2019-07-27 MED ORDER — KETOROLAC TROMETHAMINE 15 MG/ML IJ SOLN
15.0000 mg | Freq: Four times a day (QID) | INTRAMUSCULAR | Status: DC | PRN
  Administered 2019-07-27 – 2019-07-29 (×3): 15 mg via INTRAVENOUS
  Filled 2019-07-27 (×3): qty 1

## 2019-07-27 MED ORDER — ALUM & MAG HYDROXIDE-SIMETH 200-200-20 MG/5ML PO SUSP
15.0000 mL | Freq: Four times a day (QID) | ORAL | Status: DC | PRN
  Administered 2019-07-27 – 2019-07-28 (×4): 15 mL via ORAL
  Filled 2019-07-27 (×4): qty 30

## 2019-07-27 MED ORDER — METHOCARBAMOL 1000 MG/10ML IJ SOLN
500.0000 mg | Freq: Four times a day (QID) | INTRAVENOUS | Status: DC
  Administered 2019-07-27 – 2019-07-29 (×8): 500 mg via INTRAVENOUS
  Filled 2019-07-27 (×2): qty 5
  Filled 2019-07-27: qty 500
  Filled 2019-07-27 (×6): qty 5
  Filled 2019-07-27: qty 500
  Filled 2019-07-27 (×2): qty 5

## 2019-07-27 MED ORDER — MENTHOL 3 MG MT LOZG
1.0000 | LOZENGE | OROMUCOSAL | Status: DC | PRN
  Administered 2019-07-27: 3 mg via ORAL
  Filled 2019-07-27: qty 9

## 2019-07-27 MED ORDER — ENOXAPARIN SODIUM 30 MG/0.3ML ~~LOC~~ SOLN
30.0000 mg | Freq: Two times a day (BID) | SUBCUTANEOUS | Status: DC
  Administered 2019-07-27 – 2019-07-30 (×8): 30 mg via SUBCUTANEOUS
  Filled 2019-07-27 (×8): qty 0.3

## 2019-07-27 MED ORDER — PROMETHAZINE HCL 25 MG/ML IJ SOLN
12.5000 mg | Freq: Four times a day (QID) | INTRAMUSCULAR | Status: DC | PRN
  Administered 2019-07-27: 12.5 mg via INTRAVENOUS
  Filled 2019-07-27: qty 1
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
## 2019-07-27 NOTE — Progress Notes (Signed)
Notified Dr. Derrell Lolling r/t patient vomit x3 green bile during the night. Patient just informed this RN of information. Will continue to monitor.
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
## 2019-07-28 ENCOUNTER — Inpatient Hospital Stay (HOSPITAL_COMMUNITY): Payer: No Typology Code available for payment source

## 2019-07-28 ENCOUNTER — Encounter (HOSPITAL_COMMUNITY): Payer: Self-pay

## 2019-07-28 ENCOUNTER — Other Ambulatory Visit: Payer: Self-pay

## 2019-07-28 LAB — BASIC METABOLIC PANEL
Anion gap: 11 (ref 5–15)
BUN: 17 mg/dL (ref 6–20)
CO2: 27 mmol/L (ref 22–32)
Calcium: 8 mg/dL — ABNORMAL LOW (ref 8.9–10.3)
Chloride: 102 mmol/L (ref 98–111)
Creatinine, Ser: 0.93 mg/dL (ref 0.61–1.24)
GFR calc Af Amer: >60 mL/min (ref >60)
GFR calc non Af Amer: >60 mL/min (ref >60)
Glucose, Bld: 107 mg/dL — ABNORMAL HIGH (ref 70–99)
Potassium: 3.3 mmol/L — ABNORMAL LOW (ref 3.5–5.1)
Sodium: 140 mmol/L (ref 135–145)

## 2019-07-28 LAB — CBC
HCT: 27 % — ABNORMAL LOW (ref 39.0–52.0)
Hemoglobin: 9 g/dL — ABNORMAL LOW (ref 13.0–17.0)
MCH: 31.5 pg (ref 26.0–34.0)
MCHC: 33.3 g/dL (ref 30.0–36.0)
MCV: 94.4 fL (ref 80.0–100.0)
Platelets: 209 10*3/uL (ref 150–400)
RBC: 2.86 MIL/uL — ABNORMAL LOW (ref 4.22–5.81)
RDW: 14 % (ref 11.5–15.5)
WBC: 8.3 10*3/uL (ref 4.0–10.5)
nRBC: 0.7 % — ABNORMAL HIGH (ref 0.0–0.2)

## 2019-07-28 LAB — MAGNESIUM: Magnesium: 2.3 mg/dL (ref 1.7–2.4)

## 2019-07-28 MED ORDER — PHENOL 1.4 % MT LIQD: 1.0000 | OROMUCOSAL | Status: DC | PRN

## 2019-07-28 MED ORDER — PANTOPRAZOLE SODIUM 40 MG IV SOLR
40.0000 mg | INTRAVENOUS | Status: DC
  Administered 2019-07-28: 40 mg via INTRAVENOUS
  Filled 2019-07-28: qty 40

## 2019-07-28 MED ORDER — POTASSIUM CHLORIDE 10 MEQ/100ML IV SOLN
10.0000 meq | INTRAVENOUS | Status: AC
  Administered 2019-07-28 (×3): 10 meq via INTRAVENOUS
  Filled 2019-07-28 (×3): qty 100
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
## 2019-07-28 NOTE — Plan of Care (Signed)

## 2019-07-28 NOTE — Plan of Care (Signed)
  Problem: Clinical Measurements: Goal: Respiratory complications will improve Outcome: Progressing   Problem: Activity: Goal: Risk for activity intolerance will decrease Outcome: Progressing   Problem: Nutrition: Goal: Adequate nutrition will be maintained Outcome: Progressing   Problem: Coping: Goal: Level of anxiety will decrease Outcome: Progressing   Problem: Elimination: Goal: Will not experience complications related to bowel motility Outcome: Progressing Goal: Will not experience complications related to urinary retention Outcome: Progressing   Problem: Pain Managment: Goal: General experience of comfort will improve Outcome: Progressing   Problem: Safety: Goal: Ability to remain free from injury will improve Outcome: Progressing   Problem: Skin Integrity: Goal: Risk for impaired skin integrity will decrease Outcome: Progressing   
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
## 2019-07-29 LAB — BASIC METABOLIC PANEL
Anion gap: 10 (ref 5–15)
BUN: 15 mg/dL (ref 6–20)
CO2: 22 mmol/L (ref 22–32)
Calcium: 7.7 mg/dL — ABNORMAL LOW (ref 8.9–10.3)
Chloride: 106 mmol/L (ref 98–111)
Creatinine, Ser: 0.79 mg/dL (ref 0.61–1.24)
GFR calc Af Amer: >60 mL/min (ref >60)
GFR calc non Af Amer: >60 mL/min (ref >60)
Glucose, Bld: 97 mg/dL (ref 70–99)
Potassium: 3.5 mmol/L (ref 3.5–5.1)
Sodium: 138 mmol/L (ref 135–145)

## 2019-07-29 LAB — CBC
HCT: 26.7 % — ABNORMAL LOW (ref 39.0–52.0)
Hemoglobin: 9 g/dL — ABNORMAL LOW (ref 13.0–17.0)
MCH: 32.6 pg (ref 26.0–34.0)
MCHC: 33.7 g/dL (ref 30.0–36.0)
MCV: 96.7 fL (ref 80.0–100.0)
Platelets: 250 10*3/uL (ref 150–400)
RBC: 2.76 MIL/uL — ABNORMAL LOW (ref 4.22–5.81)
RDW: 14 % (ref 11.5–15.5)
WBC: 6.4 10*3/uL (ref 4.0–10.5)
nRBC: 0.8 % — ABNORMAL HIGH (ref 0.0–0.2)

## 2019-07-29 MED ORDER — PANTOPRAZOLE SODIUM 40 MG PO TBEC
40.0000 mg | DELAYED_RELEASE_TABLET | Freq: Every day | ORAL | Status: DC
  Administered 2019-07-29 – 2019-07-31 (×3): 40 mg via ORAL
  Filled 2019-07-29 (×3): qty 1

## 2019-07-29 MED ORDER — METHOCARBAMOL 500 MG PO TABS
500.0000 mg | ORAL_TABLET | Freq: Four times a day (QID) | ORAL | Status: DC
  Administered 2019-07-29 – 2019-07-31 (×9): 500 mg via ORAL
  Filled 2019-07-29 (×9): qty 1

## 2019-07-29 MED ORDER — METHOCARBAMOL 500 MG PO TABS: 500.0000 mg | ORAL_TABLET | Freq: Four times a day (QID) | ORAL | 0 refills | Status: DC | PRN

## 2019-07-29 MED ORDER — OXYCODONE HCL 5 MG PO TABS: 5.0000 mg | ORAL_TABLET | ORAL | 0 refills | Status: DC | PRN

## 2019-07-29 MED ORDER — BOOST / RESOURCE BREEZE PO LIQD CUSTOM
1.0000 | Freq: Three times a day (TID) | ORAL | Status: DC
  Administered 2019-07-29 – 2019-07-31 (×6): 1 via ORAL

## 2019-07-29 MED FILL — METHOCARBAMOL 500 MG TABS: 500 | 8 days supply | Qty: 30 | Fill #0

## 2019-07-29 MED FILL — oxyCODONE HCL 5 MG TABS: 5 | 4 days supply | Qty: 30 | Fill #0
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
## 2019-07-29 NOTE — Plan of Care (Signed)
  Problem: Education: Goal: Knowledge of General Education information will improve Description: Including pain rating scale, medication(s)/side effects and non-pharmacologic comfort measures Outcome: Progressing   Problem: Education: Goal: Knowledge of General Education information will improve Description: Including pain rating scale, medication(s)/side effects and non-pharmacologic comfort measures Outcome: Progressing   Problem: Health Behavior/Discharge Planning: Goal: Ability to manage health-related needs will improve Outcome: Progressing   Problem: Clinical Measurements: Goal: Ability to maintain clinical measurements within normal limits will improve Outcome: Progressing Goal: Will remain free from infection Outcome: Progressing Goal: Diagnostic test results will improve Outcome: Progressing Goal: Respiratory complications will improve Outcome: Progressing Goal: Cardiovascular complication will be avoided Outcome: Progressing   Problem: Coping: Goal: Level of anxiety will decrease Outcome: Progressing   Problem: Nutrition: Goal: Adequate nutrition will be maintained Outcome: Progressing   Problem: Elimination: Goal: Will not experience complications related to bowel motility Outcome: Progressing Goal: Will not experience complications related to urinary retention Outcome: Progressing   Problem: Pain Managment: Goal: General experience of comfort will improve Outcome: Progressing

## 2019-07-29 NOTE — Progress Notes (Signed)
Orthopedic Tech Progress Note Patient Details:  Jeffrey Foley [birth year] [id number]  Ortho Devices Type of Ortho Device: Knee Immobilizer Ortho Device/Splint Location: RLE Ortho Device/Splint Interventions: Ordered, Application, Adjustment   Post Interventions Patient Tolerated: Well Instructions Provided: Adjustment of device, Care of device, Poper ambulation with device   Jeffrey Foley 07/29/2019, 5:39 PM
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
## 2019-07-31 LAB — CREATININE, SERUM
Creatinine, Ser: 0.76 mg/dL (ref 0.61–1.24)
GFR calc Af Amer: >60 mL/min (ref >60)
GFR calc non Af Amer: >60 mL/min (ref >60)
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
## 2019-07-31 NOTE — Plan of Care (Signed)
  Problem: Education: Goal: Knowledge of General Education information will improve Description: Including pain rating scale, medication(s)/side effects and non-pharmacologic comfort measures Outcome: Adequate for Discharge   Problem: Health Behavior/Discharge Planning: Goal: Ability to manage health-related needs will improve Outcome: Adequate for Discharge   Problem: Clinical Measurements: Goal: Ability to maintain clinical measurements within normal limits will improve Outcome: Adequate for Discharge Goal: Will remain free from infection Outcome: Adequate for Discharge Goal: Diagnostic test results will improve Outcome: Adequate for Discharge Goal: Respiratory complications will improve Outcome: Adequate for Discharge Goal: Cardiovascular complication will be avoided Outcome: Adequate for Discharge   Problem: Activity: Goal: Risk for activity intolerance will decrease Outcome: Adequate for Discharge   Problem: Nutrition: Goal: Adequate nutrition will be maintained Outcome: Adequate for Discharge   Problem: Coping: Goal: Level of anxiety will decrease Outcome: Adequate for Discharge   Problem: Elimination: Goal: Will not experience complications related to bowel motility Outcome: Adequate for Discharge Goal: Will not experience complications related to urinary retention Outcome: Adequate for Discharge   Problem: Pain Managment: Goal: General experience of comfort will improve Outcome: Adequate for Discharge   Problem: Safety: Goal: Ability to remain free from injury will improve Outcome: Adequate for Discharge   Problem: Skin Integrity: Goal: Risk for impaired skin integrity will decrease Outcome: Adequate for Discharge   Problem: Acute Rehab PT Goals(only PT should resolve) Goal: Pt will Roll Supine to Side Outcome: Adequate for Discharge Goal: Pt Will Go Supine/Side To Sit Outcome: Adequate for Discharge Goal: Patient Will Transfer Sit To/From  Stand Outcome: Adequate for Discharge Goal: Pt Will Transfer Bed To Chair/Chair To Bed Outcome: Adequate for Discharge Goal: Pt Will Ambulate Outcome: Adequate for Discharge Goal: Pt Will Go Up/Down Stairs Outcome: Adequate for Discharge   Discharge instructions reviewed with patient and significant other via digital translation assist.  These included, but were not limited to, the following:  wound care, present medications, when to call the MD, S&S of infection, activity, and safe numbers to call should the situation warrant.  Patient escorted via wheelchair to exit by nurse tech.

## 2019-09-03 ENCOUNTER — Inpatient Hospital Stay (HOSPITAL_COMMUNITY)
Admission: EM | Admit: 2019-09-03 | Discharge: 2019-09-05 | DRG: 392 | Disposition: A | Discharge status: Home / Self Care | Payer: BC Managed Care – PPO | Attending: Surgery | Admitting: Surgery

## 2019-09-03 ENCOUNTER — Emergency Department (HOSPITAL_COMMUNITY): Payer: BC Managed Care – PPO

## 2019-09-03 ENCOUNTER — Encounter (HOSPITAL_COMMUNITY): Payer: Self-pay | Admitting: Emergency Medicine

## 2019-09-03 ENCOUNTER — Other Ambulatory Visit: Payer: Self-pay

## 2019-09-03 DIAGNOSIS — S31139D Puncture wound of abdominal wall without foreign body, unspecified quadrant without penetration into peritoneal cavity, subsequent encounter: Secondary | ICD-10-CM

## 2019-09-03 DIAGNOSIS — D72829 Elevated white blood cell count, unspecified: Secondary | ICD-10-CM | POA: Diagnosis present

## 2019-09-03 DIAGNOSIS — Z20822 Contact with and (suspected) exposure to covid-19: Secondary | ICD-10-CM | POA: Diagnosis present

## 2019-09-03 DIAGNOSIS — W3400XD Accidental discharge from unspecified firearms or gun, subsequent encounter: Secondary | ICD-10-CM | POA: Diagnosis not present

## 2019-09-03 DIAGNOSIS — M79604 Pain in right leg: Secondary | ICD-10-CM | POA: Diagnosis present

## 2019-09-03 DIAGNOSIS — Z9889 Other specified postprocedural states: Secondary | ICD-10-CM | POA: Diagnosis present

## 2019-09-03 DIAGNOSIS — Z87891 Personal history of nicotine dependence: Secondary | ICD-10-CM | POA: Diagnosis not present

## 2019-09-03 DIAGNOSIS — R1032 Left lower quadrant pain: Secondary | ICD-10-CM | POA: Diagnosis present

## 2019-09-03 DIAGNOSIS — B999 Unspecified infectious disease: Secondary | ICD-10-CM

## 2019-09-03 LAB — BASIC METABOLIC PANEL
Anion gap: 12 (ref 5–15)
BUN: 12 mg/dL (ref 6–20)
CO2: 22 mmol/L (ref 22–32)
Calcium: 9.4 mg/dL (ref 8.9–10.3)
Chloride: 103 mmol/L (ref 98–111)
Creatinine, Ser: 0.94 mg/dL (ref 0.61–1.24)
GFR calc Af Amer: >60 mL/min (ref >60)
GFR calc non Af Amer: >60 mL/min (ref >60)
Glucose, Bld: 104 mg/dL — ABNORMAL HIGH (ref 70–99)
Potassium: 4.1 mmol/L (ref 3.5–5.1)
Sodium: 137 mmol/L (ref 135–145)

## 2019-09-03 LAB — CBC
HCT: 41.2 % (ref 39.0–52.0)
Hemoglobin: 13.2 g/dL (ref 13.0–17.0)
MCH: 30.8 pg (ref 26.0–34.0)
MCHC: 32 g/dL (ref 30.0–36.0)
MCV: 96 fL (ref 80.0–100.0)
Platelets: 483 10*3/uL — ABNORMAL HIGH (ref 150–400)
RBC: 4.29 MIL/uL (ref 4.22–5.81)
RDW: 13.2 % (ref 11.5–15.5)
WBC: 10.9 10*3/uL — ABNORMAL HIGH (ref 4.0–10.5)
nRBC: 0 % (ref 0.0–0.2)

## 2019-09-03 LAB — SARS CORONAVIRUS 2 BY RT PCR (HOSPITAL ORDER, PERFORMED IN ~~LOC~~ HOSPITAL LAB): SARS Coronavirus 2: NEGATIVE

## 2019-09-03 MED ORDER — HYDROMORPHONE HCL 1 MG/ML IJ SOLN
1.0000 mg | INTRAMUSCULAR | Status: DC | PRN
  Administered 2019-09-03 – 2019-09-04 (×3): 1 mg via INTRAVENOUS
  Filled 2019-09-03 (×3): qty 1

## 2019-09-03 MED ORDER — ONDANSETRON 4 MG PO TBDP: 4.0000 mg | ORAL_TABLET | Freq: Four times a day (QID) | ORAL | Status: DC | PRN

## 2019-09-03 MED ORDER — METRONIDAZOLE IN NACL 5-0.79 MG/ML-% IV SOLN: 500.0000 mg | Freq: Once | INTRAVENOUS | Status: DC

## 2019-09-03 MED ORDER — IOHEXOL 300 MG/ML  SOLN
100.0000 mL | Freq: Once | INTRAMUSCULAR | Status: AC | PRN
  Administered 2019-09-03: 100 mL via INTRAVENOUS

## 2019-09-03 MED ORDER — TRAMADOL HCL 50 MG PO TABS
50.0000 mg | ORAL_TABLET | Freq: Four times a day (QID) | ORAL | Status: DC | PRN
  Administered 2019-09-05: 50 mg via ORAL
  Filled 2019-09-03 (×2): qty 1

## 2019-09-03 MED ORDER — PIPERACILLIN-TAZOBACTAM 3.375 G IVPB 30 MIN
3.3750 g | Freq: Once | INTRAVENOUS | Status: AC
  Administered 2019-09-03: 3.375 g via INTRAVENOUS
  Filled 2019-09-03: qty 50

## 2019-09-03 MED ORDER — ENOXAPARIN SODIUM 40 MG/0.4ML ~~LOC~~ SOLN
40.0000 mg | SUBCUTANEOUS | Status: DC
  Administered 2019-09-03 – 2019-09-04 (×2): 40 mg via SUBCUTANEOUS
  Filled 2019-09-03 (×2): qty 0.4

## 2019-09-03 MED ORDER — PIPERACILLIN-TAZOBACTAM 3.375 G IVPB
3.3750 g | Freq: Three times a day (TID) | INTRAVENOUS | Status: DC
  Administered 2019-09-04 – 2019-09-05 (×5): 3.375 g via INTRAVENOUS
  Filled 2019-09-03 (×7): qty 50

## 2019-09-03 MED ORDER — ONDANSETRON HCL 4 MG/2ML IJ SOLN
4.0000 mg | Freq: Four times a day (QID) | INTRAMUSCULAR | Status: DC | PRN
  Administered 2019-09-04: 4 mg via INTRAVENOUS
  Filled 2019-09-03: qty 2
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
## 2019-09-03 NOTE — ED Notes (Signed)
Pt returned from CT °

## 2019-09-04 LAB — BASIC METABOLIC PANEL
Anion gap: 12 (ref 5–15)
BUN: 12 mg/dL (ref 6–20)
CO2: 24 mmol/L (ref 22–32)
Calcium: 9.2 mg/dL (ref 8.9–10.3)
Chloride: 101 mmol/L (ref 98–111)
Creatinine, Ser: 0.97 mg/dL (ref 0.61–1.24)
GFR calc Af Amer: >60 mL/min (ref >60)
GFR calc non Af Amer: >60 mL/min (ref >60)
Glucose, Bld: 118 mg/dL — ABNORMAL HIGH (ref 70–99)
Potassium: 4.1 mmol/L (ref 3.5–5.1)
Sodium: 137 mmol/L (ref 135–145)

## 2019-09-04 LAB — CBC
HCT: 35.9 % — ABNORMAL LOW (ref 39.0–52.0)
Hemoglobin: 11.8 g/dL — ABNORMAL LOW (ref 13.0–17.0)
MCH: 31.1 pg (ref 26.0–34.0)
MCHC: 32.9 g/dL (ref 30.0–36.0)
MCV: 94.5 fL (ref 80.0–100.0)
Platelets: 453 10*3/uL — ABNORMAL HIGH (ref 150–400)
RBC: 3.8 MIL/uL — ABNORMAL LOW (ref 4.22–5.81)
RDW: 13.1 % (ref 11.5–15.5)
WBC: 9.5 10*3/uL (ref 4.0–10.5)
nRBC: 0 % (ref 0.0–0.2)

## 2019-09-04 MED ORDER — GABAPENTIN 100 MG PO CAPS
100.0000 mg | ORAL_CAPSULE | Freq: Three times a day (TID) | ORAL | Status: DC
  Administered 2019-09-04 – 2019-09-05 (×5): 100 mg via ORAL
  Filled 2019-09-04 (×5): qty 1

## 2019-09-04 MED ORDER — ACETAMINOPHEN 325 MG PO TABS
650.0000 mg | ORAL_TABLET | Freq: Four times a day (QID) | ORAL | Status: DC
  Administered 2019-09-04 – 2019-09-05 (×6): 650 mg via ORAL
  Filled 2019-09-04 (×6): qty 2

## 2019-09-04 MED ORDER — HYDROMORPHONE HCL 1 MG/ML IJ SOLN
0.5000 mg | INTRAMUSCULAR | Status: DC | PRN
  Administered 2019-09-04 – 2019-09-05 (×3): 0.5 mg via INTRAVENOUS
  Filled 2019-09-04 (×3): qty 1
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
## 2019-09-05 MED ORDER — GABAPENTIN 300 MG PO CAPS: 300.0000 mg | ORAL_CAPSULE | Freq: Three times a day (TID) | ORAL | 0 refills | Status: DC

## 2019-09-05 MED ORDER — TRAMADOL HCL 50 MG PO TABS: 50.0000 mg | ORAL_TABLET | Freq: Four times a day (QID) | ORAL | 0 refills | Status: DC | PRN

## 2019-09-05 MED ORDER — AMOXICILLIN-POT CLAVULANATE 875-125 MG PO TABS: 1.0000 | ORAL_TABLET | Freq: Two times a day (BID) | ORAL | 0 refills | Status: AC

## 2019-09-05 MED ORDER — ACETAMINOPHEN 325 MG PO TABS: 650.0000 mg | ORAL_TABLET | Freq: Four times a day (QID) | ORAL | Status: AC | PRN

## 2019-09-05 NOTE — Discharge Instructions (Signed)
DISCHARGE INSTRUCTIONS TO PATIENT  Activity: Continue no heavy lifting/pushing/pulling (10 lbs or greater) for 2 weeks                        Practice your Covid-19 protection:  Wear a mask, social distance, and wash your hands frequently   Diet:  As tolerated  Follow up appointment:  09/15/19.  Medications and dosages:  Resume your home medications.  You have a prescription for:  Tramadol, gabapentin, augmentin              You may also take Tylenol, ibuprofen, or Aleve for pain  Call Trauma clinic  [phone]) if you have:  Temperature greater than 100.4,  Persistent nausea and vomiting,  Severe uncontrolled pain,  Redness, tenderness, or signs of infection (pain, swelling, redness, odor or green/yellow discharge around the site),  Difficulty breathing, headache or visual disturbances,  Any other questions or concerns you may have after discharge.  In an emergency, call 911 or go to an Emergency Department at a nearby hospital.
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
## 2019-09-05 NOTE — Progress Notes (Signed)
Pt given discharge summary and discharged via girlfriend as transportation.  

## 2019-09-06 ENCOUNTER — Other Ambulatory Visit (HOSPITAL_COMMUNITY): Payer: Self-pay | Admitting: Surgery

## 2019-09-06 ENCOUNTER — Ambulatory Visit (HOSPITAL_COMMUNITY)
Admission: RE | Admit: 2019-09-06 | Discharge: 2019-09-06 | Disposition: A | Discharge status: Home / Self Care | Payer: BC Managed Care – PPO | Source: Ambulatory Visit | Attending: Surgery | Admitting: Surgery

## 2019-09-06 DIAGNOSIS — Z9889 Other specified postprocedural states: Secondary | ICD-10-CM | POA: Insufficient documentation

## 2019-09-06 MED ORDER — IOHEXOL 300 MG/ML  SOLN
100.0000 mL | Freq: Once | INTRAMUSCULAR | Status: AC | PRN
  Administered 2019-09-06: 100 mL via INTRAVENOUS

## 2019-10-18 ENCOUNTER — Telehealth: Payer: Self-pay | Admitting: Neurology

## 2019-10-18 ENCOUNTER — Encounter: Payer: Self-pay | Admitting: Neurology

## 2019-10-18 ENCOUNTER — Other Ambulatory Visit: Payer: Self-pay

## 2019-10-18 ENCOUNTER — Ambulatory Visit: Payer: BC Managed Care – PPO | Admitting: Neurology

## 2019-10-18 VITALS — BP 116/88 | HR 82 | Ht 65.0 in | Wt 149.0 lb

## 2019-10-18 DIAGNOSIS — M25561 Pain in right knee: Secondary | ICD-10-CM | POA: Diagnosis not present

## 2019-10-18 DIAGNOSIS — G8929 Other chronic pain: Secondary | ICD-10-CM | POA: Insufficient documentation

## 2019-10-18 DIAGNOSIS — W3400XA Accidental discharge from unspecified firearms or gun, initial encounter: Secondary | ICD-10-CM

## 2019-10-18 MED ORDER — MELOXICAM 15 MG PO TABS: 15.0000 mg | ORAL_TABLET | Freq: Every day | ORAL | 5 refills | Status: AC | PRN

## 2019-10-18 NOTE — Telephone Encounter (Signed)
[employer] Auth: NPR via [employer] order sent to GI. They will reach out to the patient to schedule.
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
## 2020-05-10 ENCOUNTER — Other Ambulatory Visit: Payer: Self-pay | Admitting: General Surgery

## 2020-05-10 ENCOUNTER — Inpatient Hospital Stay (HOSPITAL_COMMUNITY): Payer: BC Managed Care – PPO

## 2020-05-10 ENCOUNTER — Other Ambulatory Visit: Payer: Self-pay | Admitting: Student

## 2020-05-10 ENCOUNTER — Inpatient Hospital Stay (HOSPITAL_COMMUNITY)
Admission: AD | Admit: 2020-05-10 | Discharge: 2020-05-14 | DRG: 602 | Disposition: A | Discharge status: Home / Self Care | Payer: BC Managed Care – PPO | Source: Ambulatory Visit | Attending: General Surgery | Admitting: General Surgery

## 2020-05-10 DIAGNOSIS — S31644S Puncture wound with foreign body of abdominal wall, left lower quadrant with penetration into peritoneal cavity, sequela: Secondary | ICD-10-CM | POA: Diagnosis not present

## 2020-05-10 DIAGNOSIS — K651 Peritoneal abscess: Secondary | ICD-10-CM | POA: Diagnosis present

## 2020-05-10 DIAGNOSIS — Z87891 Personal history of nicotine dependence: Secondary | ICD-10-CM

## 2020-05-10 DIAGNOSIS — L03311 Cellulitis of abdominal wall: Principal | ICD-10-CM | POA: Diagnosis present

## 2020-05-10 DIAGNOSIS — L02211 Cutaneous abscess of abdominal wall: Secondary | ICD-10-CM | POA: Diagnosis present

## 2020-05-10 DIAGNOSIS — W3400XS Accidental discharge from unspecified firearms or gun, sequela: Secondary | ICD-10-CM | POA: Diagnosis not present

## 2020-05-10 DIAGNOSIS — Z20822 Contact with and (suspected) exposure to covid-19: Secondary | ICD-10-CM | POA: Diagnosis present

## 2020-05-10 LAB — BASIC METABOLIC PANEL
Anion gap: 6 (ref 5–15)
BUN: 13 mg/dL (ref 6–20)
CO2: 28 mmol/L (ref 22–32)
Calcium: 9 mg/dL (ref 8.9–10.3)
Chloride: 103 mmol/L (ref 98–111)
Creatinine, Ser: 0.9 mg/dL (ref 0.61–1.24)
GFR, Estimated: >60 mL/min (ref >60)
Glucose, Bld: 91 mg/dL (ref 70–99)
Potassium: 3.9 mmol/L (ref 3.5–5.1)
Sodium: 137 mmol/L (ref 135–145)

## 2020-05-10 LAB — CBC
HCT: 40.7 % (ref 39.0–52.0)
Hemoglobin: 13 g/dL (ref 13.0–17.0)
MCH: 29.8 pg (ref 26.0–34.0)
MCHC: 31.9 g/dL (ref 30.0–36.0)
MCV: 93.3 fL (ref 80.0–100.0)
Platelets: 412 10*3/uL — ABNORMAL HIGH (ref 150–400)
RBC: 4.36 MIL/uL (ref 4.22–5.81)
RDW: 13.7 % (ref 11.5–15.5)
WBC: 8.5 10*3/uL (ref 4.0–10.5)
nRBC: 0 % (ref 0.0–0.2)

## 2020-05-10 LAB — SARS CORONAVIRUS 2 (TAT 6-24 HRS): SARS Coronavirus 2: NEGATIVE

## 2020-05-10 MED ORDER — ONDANSETRON HCL 4 MG/2ML IJ SOLN: 4.0000 mg | Freq: Four times a day (QID) | INTRAMUSCULAR | Status: DC | PRN

## 2020-05-10 MED ORDER — IOHEXOL 9 MG/ML PO SOLN
ORAL | Status: AC
  Administered 2020-05-10: 500 mL
  Filled 2020-05-10: qty 1000

## 2020-05-10 MED ORDER — KCL IN DEXTROSE-NACL 20-5-0.45 MEQ/L-%-% IV SOLN
INTRAVENOUS | Status: DC
  Filled 2020-05-10 (×3): qty 1000

## 2020-05-10 MED ORDER — ACETAMINOPHEN 325 MG PO TABS
650.0000 mg | ORAL_TABLET | ORAL | Status: DC | PRN
Start: 2020-05-10 — End: 2020-05-14
  Administered 2020-05-11: 650 mg via ORAL
  Filled 2020-05-10: qty 2

## 2020-05-10 MED ORDER — MORPHINE SULFATE (PF) 2 MG/ML IV SOLN: 1.0000 mg | INTRAVENOUS | Status: DC | PRN

## 2020-05-10 MED ORDER — METOPROLOL TARTRATE 5 MG/5ML IV SOLN: 5.0000 mg | Freq: Four times a day (QID) | INTRAVENOUS | Status: DC | PRN

## 2020-05-10 MED ORDER — OXYCODONE HCL 5 MG PO TABS: 5.0000 mg | ORAL_TABLET | ORAL | Status: DC | PRN

## 2020-05-10 MED ORDER — ENOXAPARIN SODIUM 40 MG/0.4ML ~~LOC~~ SOLN: 40.0000 mg | SUBCUTANEOUS | Status: DC

## 2020-05-10 MED ORDER — IOHEXOL 300 MG/ML  SOLN
80.0000 mL | Freq: Once | INTRAMUSCULAR | Status: AC | PRN
  Administered 2020-05-10: 80 mL via INTRAVENOUS

## 2020-05-10 MED ORDER — BACITRACIN ZINC 500 UNIT/GM EX OINT
TOPICAL_OINTMENT | Freq: Every day | CUTANEOUS | Status: DC
  Filled 2020-05-10 (×2): qty 28.35

## 2020-05-10 MED ORDER — SODIUM CHLORIDE 0.9 % IV SOLN
2.0000 g | INTRAVENOUS | Status: DC
  Administered 2020-05-10 – 2020-05-13 (×4): 2 g via INTRAVENOUS
  Filled 2020-05-10 (×3): qty 2
  Filled 2020-05-10 (×2): qty 20

## 2020-05-10 MED ORDER — ONDANSETRON 4 MG PO TBDP: 4.0000 mg | ORAL_TABLET | Freq: Four times a day (QID) | ORAL | Status: DC | PRN

## 2020-05-10 NOTE — H&P (Deleted)
  The note originally documented on this encounter has been moved the the encounter in which it belongs.  
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
## 2020-05-11 LAB — BASIC METABOLIC PANEL
Anion gap: 13 (ref 5–15)
BUN: 11 mg/dL (ref 6–20)
CO2: 20 mmol/L — ABNORMAL LOW (ref 22–32)
Calcium: 8.8 mg/dL — ABNORMAL LOW (ref 8.9–10.3)
Chloride: 104 mmol/L (ref 98–111)
Creatinine, Ser: 0.81 mg/dL (ref 0.61–1.24)
GFR, Estimated: >60 mL/min (ref >60)
Glucose, Bld: 91 mg/dL (ref 70–99)
Potassium: 4.2 mmol/L (ref 3.5–5.1)
Sodium: 137 mmol/L (ref 135–145)

## 2020-05-11 LAB — CBC
HCT: 41.4 % (ref 39.0–52.0)
Hemoglobin: 13.3 g/dL (ref 13.0–17.0)
MCH: 30.1 pg (ref 26.0–34.0)
MCHC: 32.1 g/dL (ref 30.0–36.0)
MCV: 93.7 fL (ref 80.0–100.0)
Platelets: 379 10*3/uL (ref 150–400)
RBC: 4.42 MIL/uL (ref 4.22–5.81)
RDW: 13.7 % (ref 11.5–15.5)
WBC: 6.8 10*3/uL (ref 4.0–10.5)
nRBC: 0 % (ref 0.0–0.2)

## 2020-05-11 MED ORDER — ENOXAPARIN SODIUM 40 MG/0.4ML ~~LOC~~ SOLN
40.0000 mg | SUBCUTANEOUS | Status: DC
  Administered 2020-05-12 – 2020-05-13 (×2): 40 mg via SUBCUTANEOUS
  Filled 2020-05-11 (×2): qty 0.4

## 2020-05-11 NOTE — Progress Notes (Signed)
Patient ID: Jeffrey Foley, male   DOB: Mar 31, 1985, 35 y.o.   MRN: [id number] I reviewed the CT findings with him.  He does not have a hernia, but he does have chronically infected omentum that has infection now involving his rectus muscle and abdominal wall.  There is a fluid collection that may be amenable to drainage by interventional radiology versus he may need operative drainage.  We will discuss further in the a.m.  Leave him n.p.o. and continue antibiotics for now.  I discussed the plan with him and answered his questions.  Violeta Gelinas, MD, MPH, FACS Please use [email or web address] to contact on call provider
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
## 2020-05-12 ENCOUNTER — Inpatient Hospital Stay (HOSPITAL_COMMUNITY): Payer: BC Managed Care – PPO

## 2020-05-12 MED ORDER — FENTANYL CITRATE (PF) 100 MCG/2ML IJ SOLN
INTRAMUSCULAR | Status: AC
  Filled 2020-05-12: qty 2

## 2020-05-12 MED ORDER — LIDOCAINE HCL 1 % IJ SOLN
INTRAMUSCULAR | Status: AC
  Filled 2020-05-12: qty 20

## 2020-05-12 MED ORDER — FENTANYL CITRATE (PF) 100 MCG/2ML IJ SOLN
INTRAMUSCULAR | Status: AC | PRN
  Administered 2020-05-12: 50 ug via INTRAVENOUS

## 2020-05-12 MED ORDER — MIDAZOLAM HCL 2 MG/2ML IJ SOLN
INTRAMUSCULAR | Status: AC | PRN
  Administered 2020-05-12: 1 mg via INTRAVENOUS

## 2020-05-12 MED ORDER — MIDAZOLAM HCL 2 MG/2ML IJ SOLN
INTRAMUSCULAR | Status: AC
  Filled 2020-05-12: qty 2
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
## 2020-05-14 ENCOUNTER — Encounter (HOSPITAL_COMMUNITY): Payer: Self-pay

## 2020-05-14 ENCOUNTER — Other Ambulatory Visit (HOSPITAL_COMMUNITY): Payer: Self-pay

## 2020-05-14 MED ORDER — AMOXICILLIN-POT CLAVULANATE 875-125 MG PO TABS
1.0000 | ORAL_TABLET | Freq: Two times a day (BID) | ORAL | 0 refills | Status: AC
  Filled 2020-05-14: qty 20, 10d supply, fill #0
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
## 2020-05-14 NOTE — Discharge Instructions (Signed)
Dry dressing over left sided wound as needed. Please call for fever over 101.5, nausea, vomiting, or worsening abdominal pain

## 2020-05-17 LAB — AEROBIC/ANAEROBIC CULTURE W GRAM STAIN (SURGICAL/DEEP WOUND)
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
## 2021-07-08 IMAGING — DX DG ABD PORTABLE 1V
1 series · 1 of 1 positions shown · non-contrast
Comparison: July 23, 2019.

CLINICAL DATA: Nasogastric tube placement.

EXAM:
PORTABLE ABDOMEN - 1 VIEW

[abdomen kub]
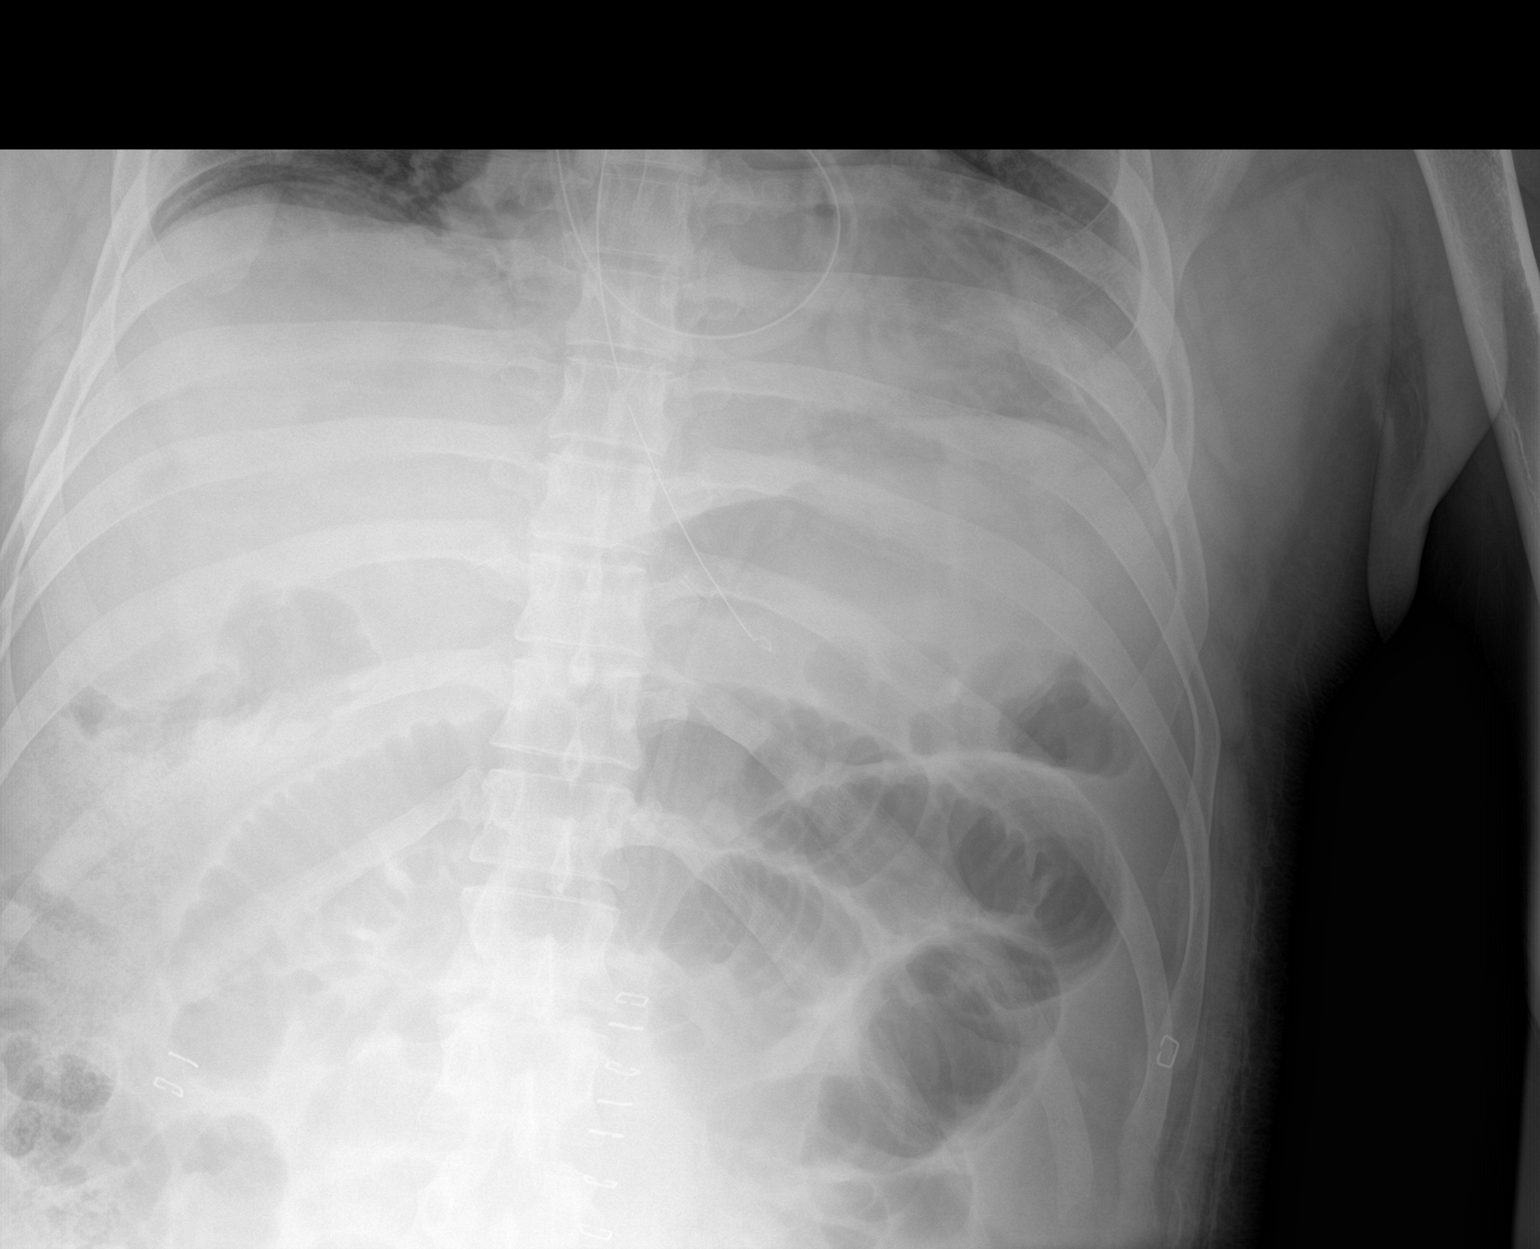

[1 of 1 positions shown; findings below may reference images not displayed]

FINDINGS: Distal tip of nasogastric tube is seen in expected position of
proximal stomach. Mildly dilated small bowel loops are noted
concerning for distal small bowel obstruction.
IMPRESSION: Distal tip of nasogastric tube seen in expected position of proximal
stomach. Advancement is recommended.
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
